# Patient Record
Sex: Female | Born: 1995 | Race: Black or African American | Hispanic: No | Marital: Single | Smoking: Never smoker
Health system: Southern US, Community
[De-identification: ages and names within clinical notes are randomized; demographics above are authoritative.]

---

## 2017-03-09 ENCOUNTER — Emergency Department (HOSPITAL_COMMUNITY)
Admission: EM | Admit: 2017-03-09 | Discharge: 2017-03-09 | Disposition: A | Discharge status: Home / Self Care | Payer: 59 | Attending: Emergency Medicine | Admitting: Emergency Medicine

## 2017-03-09 ENCOUNTER — Encounter (HOSPITAL_COMMUNITY): Payer: Self-pay | Admitting: Emergency Medicine

## 2017-03-09 ENCOUNTER — Emergency Department (HOSPITAL_COMMUNITY): Payer: 59

## 2017-03-09 DIAGNOSIS — R079 Chest pain, unspecified: Secondary | ICD-10-CM | POA: Insufficient documentation

## 2017-03-09 LAB — CBC WITH DIFFERENTIAL/PLATELET
Basophils Absolute: 0 10*3/uL (ref 0.0–0.1)
Basophils Relative: 1 %
EOS PCT: 1 %
Eosinophils Absolute: 0 10*3/uL (ref 0.0–0.7)
HCT: 41.1 % (ref 36.0–46.0)
Hemoglobin: 13.7 g/dL (ref 12.0–15.0)
LYMPHS ABS: 2.1 10*3/uL (ref 0.7–4.0)
LYMPHS PCT: 46 %
MCH: 28.4 pg (ref 26.0–34.0)
MCHC: 33.3 g/dL (ref 30.0–36.0)
MCV: 85.1 fL (ref 78.0–100.0)
MONOS PCT: 8 %
Monocytes Absolute: 0.3 10*3/uL (ref 0.1–1.0)
Neutro Abs: 1.9 10*3/uL (ref 1.7–7.7)
Neutrophils Relative %: 44 %
Platelets: 277 10*3/uL (ref 150–400)
RBC: 4.83 MIL/uL (ref 3.87–5.11)
RDW: 13.9 % (ref 11.5–15.5)
WBC: 4.4 10*3/uL (ref 4.0–10.5)

## 2017-03-09 LAB — BASIC METABOLIC PANEL
Anion gap: 9 (ref 5–15)
BUN: 10 mg/dL (ref 6–20)
CHLORIDE: 107 mmol/L (ref 101–111)
CO2: 23 mmol/L (ref 22–32)
CREATININE: 0.8 mg/dL (ref 0.44–1.00)
Calcium: 9.7 mg/dL (ref 8.9–10.3)
GFR calc Af Amer: >60 mL/min (ref >60)
GFR calc non Af Amer: >60 mL/min (ref >60)
GLUCOSE: 70 mg/dL (ref 65–99)
Potassium: 3.8 mmol/L (ref 3.5–5.1)
SODIUM: 139 mmol/L (ref 135–145)

## 2017-03-09 LAB — I-STAT TROPONIN, ED: TROPONIN I, POC: 0 ng/mL (ref 0.00–0.08)

## 2017-03-09 MED ORDER — NAPROXEN 375 MG PO TABS: 375.0000 mg | ORAL_TABLET | Freq: Two times a day (BID) | ORAL | 0 refills | Status: AC

## 2017-03-09 MED ORDER — FAMOTIDINE 20 MG PO TABS: 20.0000 mg | ORAL_TABLET | Freq: Two times a day (BID) | ORAL | 1 refills | Status: AC

## 2017-03-09 NOTE — ED Triage Notes (Signed)
Pt reports central CP for the past few weeks. Also complains of chronic L arm pain causing intermittent numbness in hand.  No SOB or dizziness.

## 2017-03-09 NOTE — Discharge Instructions (Signed)
Take the medications as prescribed, follow up with the cardiologist as planned, please review the discharge instructions

## 2017-03-09 NOTE — ED Provider Notes (Signed)
[facility]-EMERGENCY DEPT Provider Note   CSN: [id number] Arrival date & time: 03/09/17  1257   History   Chief Complaint Chief Complaint  Patient presents with  . Chest Pain  . Arm Pain    HPI Jean Sanchez is a 21 y.o. female.  HPI Pt has been having some pain in her arm since January.  She went to student health in February.  She was checked out and had an EKG.  The pain comes and goes.  Lasts a minute or so and lasts several times per day.   She then started developing some chest pain. Similar to the arm pain, comes and goes.  Nothing in particular brings it on.  It comes at rest and with movement.  She went to student health again 2 weeks ago.  She had an evaluation and was referred to cardiology.  Today the pain was more severe while walking to class so she came to the ED.   She did fly to [place] but that was it recently.  History reviewed. No pertinent past medical history.  There are no active problems to display for this patient.   History reviewed. No pertinent surgical history.  OB History    No data available       Home Medications    Prior to Admission medications   Medication Sig Start Date End Date Taking? Authorizing Provider  famotidine (PEPCID) 20 MG tablet Take 1 tablet (20 mg total) by mouth 2 (two) times daily. 03/09/17   Linwood Dibbles, MD  naproxen (NAPROSYN) 375 MG tablet Take 1 tablet (375 mg total) by mouth 2 (two) times daily. 03/09/17   Linwood Dibbles, MD  omeprazole (PRILOSEC) 40 MG capsule Take 40 mg by mouth daily. 03/02/17   Historical Provider, MD    Family History History reviewed. No pertinent family history.  Social History Social History  Substance Use Topics  . Smoking status: Never Smoker  . Smokeless tobacco: Never Used  . Alcohol use No     Allergies   Patient has no known allergies.   Review of Systems Review of Systems  All other systems reviewed and are negative.    Physical Exam Updated Vital Signs BP 133/78   Pulse 82    Temp 98.2 F (36.8 C) (Oral)   Resp 16   LMP 02/23/2017   SpO2 100%   Physical Exam  Constitutional: She appears well-developed and well-nourished. No distress.  HENT:  Head: Normocephalic and atraumatic.  Right Ear: External ear normal.  Left Ear: External ear normal.  Eyes: Conjunctivae are normal. Right eye exhibits no discharge. Left eye exhibits no discharge. No scleral icterus.  Neck: Neck supple. No tracheal deviation present.  Cardiovascular: Normal rate, regular rhythm and intact distal pulses.   Pulmonary/Chest: Effort normal and breath sounds normal. No stridor. No respiratory distress. She has no wheezes. She has no rales.  Abdominal: Soft. Bowel sounds are normal. She exhibits no distension. There is no tenderness. There is no rebound and no guarding.  Musculoskeletal: She exhibits no edema or tenderness.  Neurological: She is alert. She has normal strength. No cranial nerve deficit (no facial droop, extraocular movements intact, no slurred speech) or sensory deficit. She exhibits normal muscle tone. She displays no seizure activity. Coordination normal.  Skin: Skin is warm and dry. No rash noted.  Psychiatric: She has a normal mood and affect.  Nursing note and vitals reviewed.    ED Treatments / Results  Labs (all labs ordered are listed,  but only abnormal results are displayed) Labs Reviewed  CBC WITH DIFFERENTIAL/PLATELET  BASIC METABOLIC PANEL  I-STAT TROPOININ, ED    EKG  EKG Interpretation  Date/Time:  Tuesday March 09 2017 13:03:00 EDT Ventricular Rate:  72 PR Interval:    QRS Duration: 74 QT Interval:  368 QTC Calculation: 403 R Axis:   85 Text Interpretation:  Sinus rhythm No old tracing to compare Confirmed by Jannatul Wojdyla  MD-J, Emmerie Battaglia ([id number]) on 03/09/2017 1:06:34 PM Also confirmed by [facility]  MD-J, Red Mandt [phone]), editor Misty Stanley [phone])  on 03/09/2017 1:08:50 PM       Radiology Dg Chest 2 View  Result Date: 03/09/2017 CLINICAL DATA:  Central  chest pain over the last 3 weeks, worsening today. EXAM: CHEST  2 VIEW COMPARISON:  None. FINDINGS: Heart size is normal. Mediastinal shadows are normal. The lungs are clear. No bronchial thickening. No infiltrate, mass, effusion or collapse. Pulmonary vascularity is normal. No bony abnormality. IMPRESSION: Normal Electronically Signed   By: Paulina Fusi M.D.   On: 03/09/2017 13:40    Procedures Procedures (including critical care time)  Medications Ordered in ED Medications - No data to display   Initial Impression / Assessment and Plan / ED Course  I have reviewed the triage vital signs and the nursing notes.  Pertinent labs & imaging results that were available during my care of the patient were reviewed by me and considered in my medical decision making (see chart for details).   patient presented to the emergency room with chest pain.  She is low risk for acute coronary syndrome. Laboratory tests are reassuring. She is low risk for PE and PERC negative. These symptoms have been going on for a few months now. There is no clear relation to any particular activities or foods although when we talked about this further she did mention one time she had some upper abdominal discomfort and was given a GI cocktail. At that time her sx resolved. It's possible the symptoms are related to gastroesophageal reflux.  Will dc home with Pepcid AC.  Naprosyn as needed for pain.  Follow up with cardiology as planned.  I discussed the plan and findings with patient and her mother (over the phone).  Final Clinical Impressions(s) / ED Diagnoses   Final diagnoses:  Chest pain with low risk of acute coronary syndrome    New Prescriptions New Prescriptions   FAMOTIDINE (PEPCID) 20 MG TABLET    Take 1 tablet (20 mg total) by mouth 2 (two) times daily.   NAPROXEN (NAPROSYN) 375 MG TABLET    Take 1 tablet (375 mg total) by mouth 2 (two) times daily.     Linwood Dibbles, MD 03/09/17 [phone]

## 2017-04-08 ENCOUNTER — Ambulatory Visit: Payer: Self-pay | Admitting: Interventional Cardiology

## 2017-12-19 IMAGING — CR DG CHEST 2V
2 series · 2 of 2 positions shown · non-contrast
Comparison: None.

CLINICAL DATA: Central chest pain over the last 3 weeks, worsening
today.

EXAM:
CHEST  2 VIEW

[w chest pa]
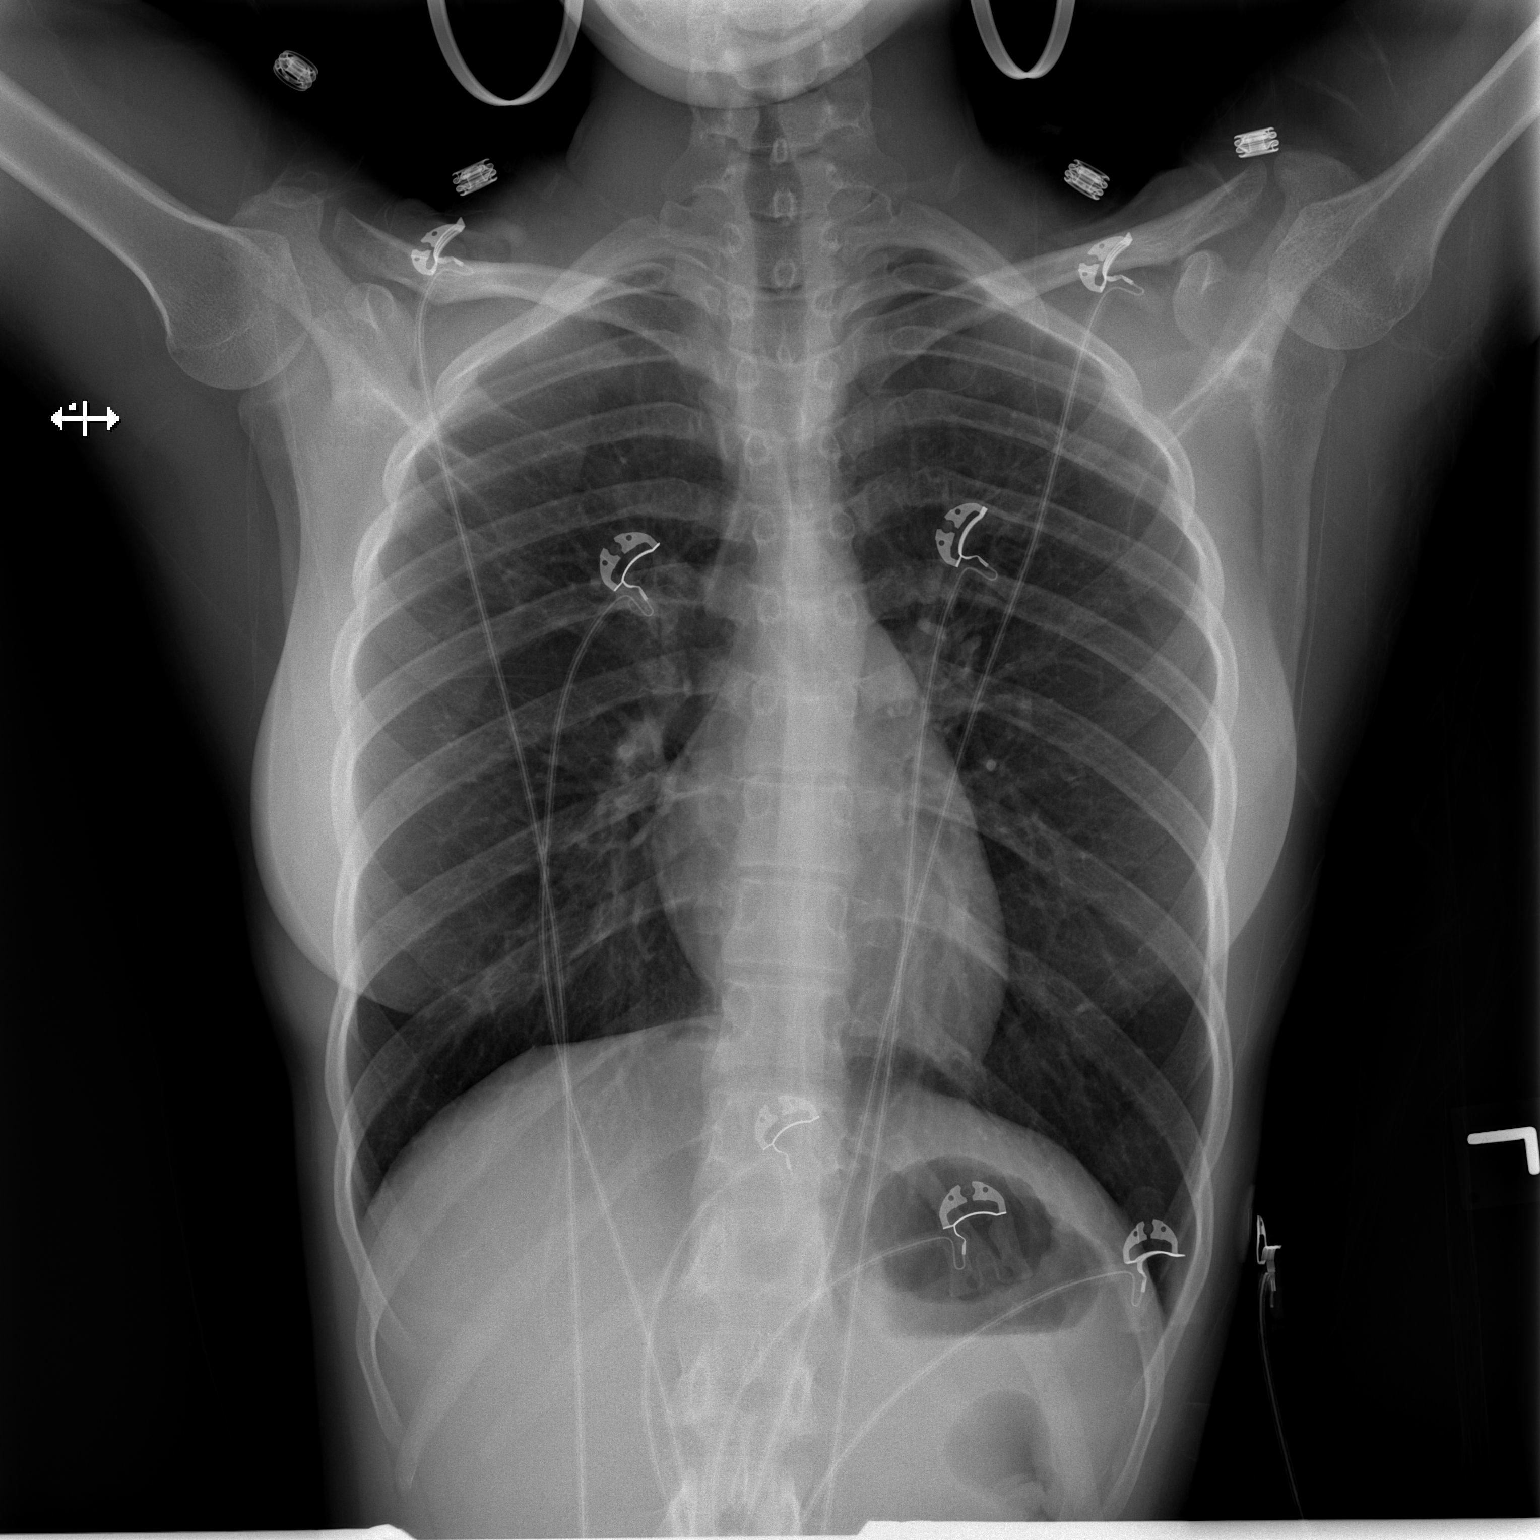

[w chest lat]
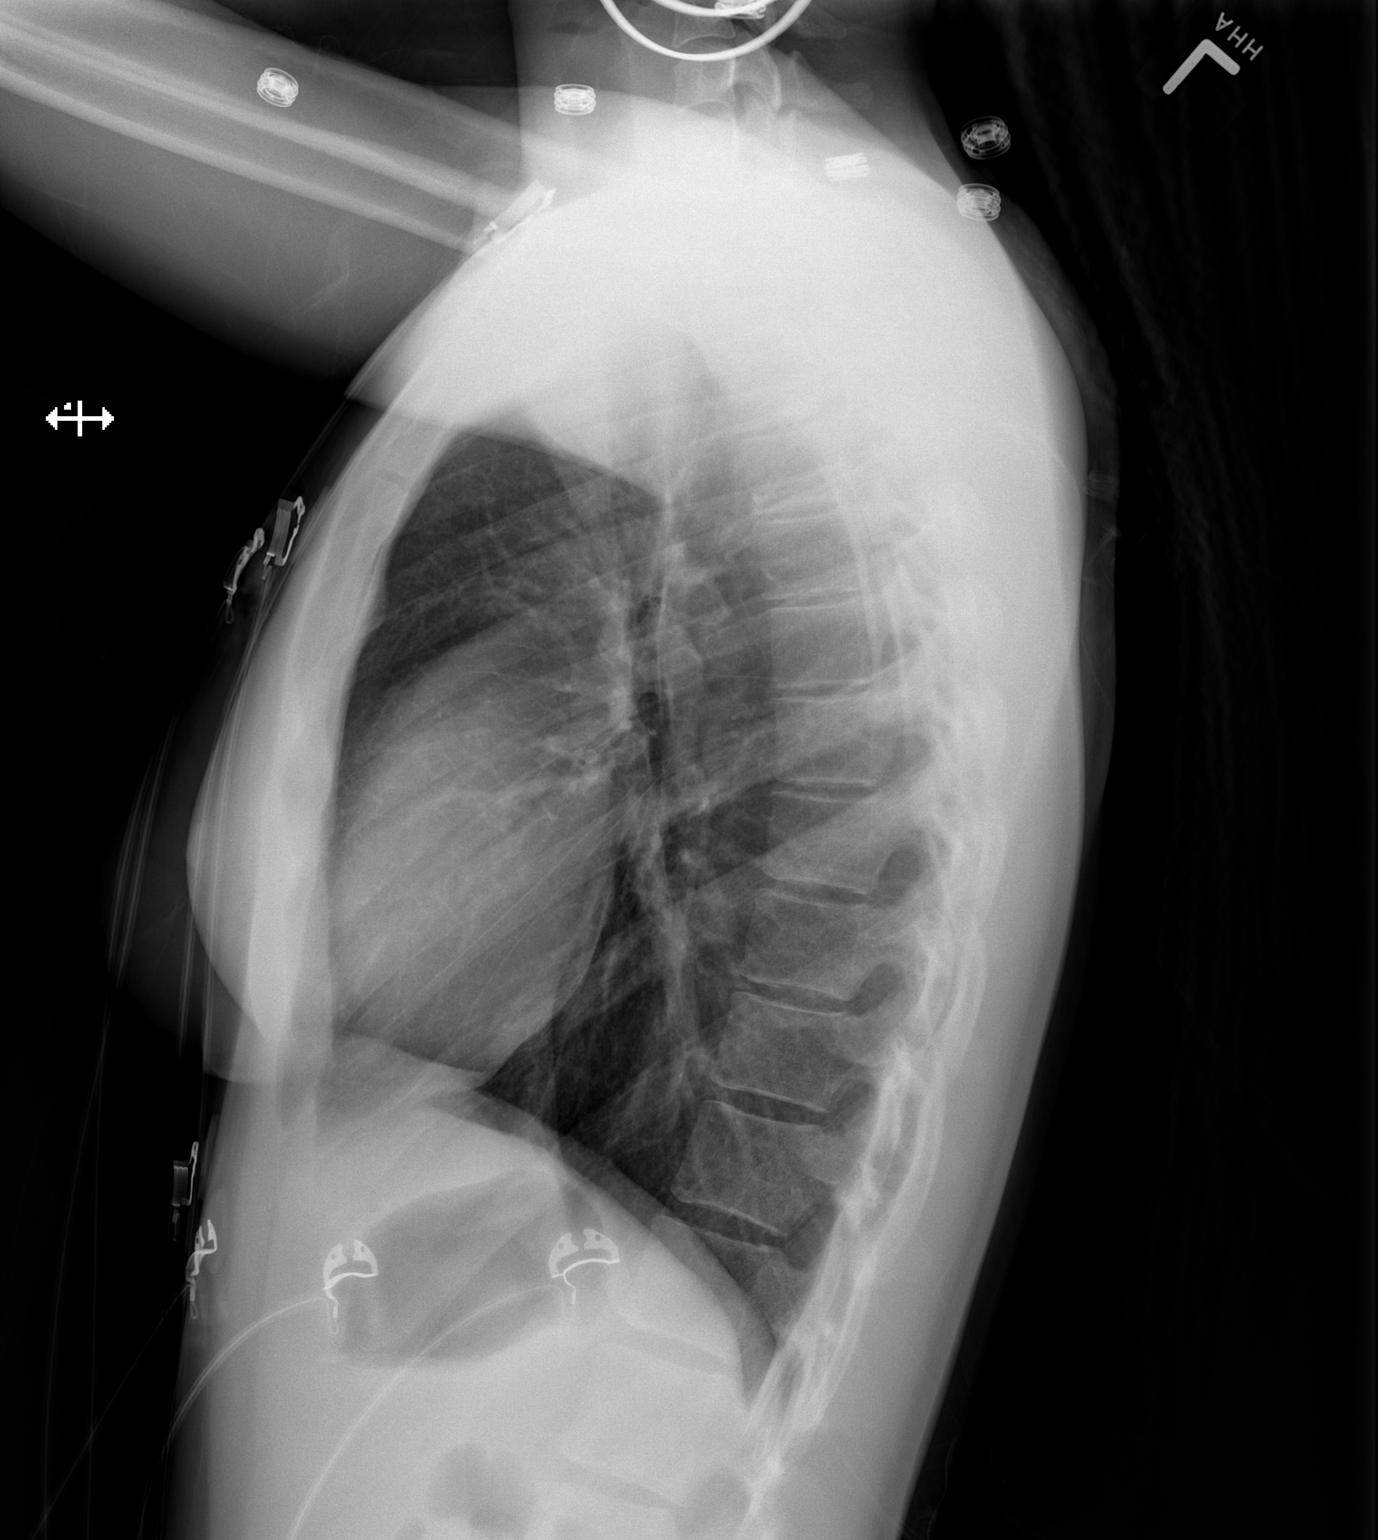

[2 of 2 positions shown; findings below may reference images not displayed]

FINDINGS: Heart size is normal. Mediastinal shadows are normal. The lungs are
clear. No bronchial thickening. No infiltrate, mass, effusion or
collapse. Pulmonary vascularity is normal. No bony abnormality.
IMPRESSION: Normal
